# Patient Record
Sex: Female | Born: 2000 | Race: White | Hispanic: No | Marital: Single | Smoking: Never smoker
Health system: Southern US, Community
[De-identification: ages and names within clinical notes are randomized; demographics above are authoritative.]

## PROBLEM LIST (undated history)

## (undated) DIAGNOSIS — J45909 Unspecified asthma, uncomplicated: Secondary | ICD-10-CM

## (undated) HISTORY — DX: Unspecified asthma, uncomplicated: J45.909

## (undated) HISTORY — PX: ADENOIDECTOMY: SUR15

---
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
## 2019-03-11 ENCOUNTER — Ambulatory Visit: Payer: BLUE CROSS/BLUE SHIELD | Admitting: Osteopathic Medicine

## 2019-03-11 ENCOUNTER — Ambulatory Visit (INDEPENDENT_AMBULATORY_CARE_PROVIDER_SITE_OTHER): Payer: BLUE CROSS/BLUE SHIELD | Admitting: Osteopathic Medicine

## 2019-03-11 ENCOUNTER — Encounter: Payer: Self-pay | Admitting: Osteopathic Medicine

## 2019-03-11 VITALS — BP 99/68 | HR 67 | Temp 99.0°F | Wt 138.3 lb

## 2019-03-11 DIAGNOSIS — B354 Tinea corporis: Secondary | ICD-10-CM

## 2019-03-11 DIAGNOSIS — D229 Melanocytic nevi, unspecified: Secondary | ICD-10-CM | POA: Diagnosis not present

## 2019-03-11 MED ORDER — TERBINAFINE HCL 1 % EX CREA: TOPICAL_CREAM | CUTANEOUS | 1 refills | Status: DC

## 2019-03-11 NOTE — Progress Notes (Signed)
HPI: Bailey Pratt is a 18 y.o. female who  has a past medical history of Asthma.  she presents to [facility] today, 03/11/19,  for chief complaint of:  Rash   Rash . Context: reports has had this before  . Location: chest, back, creases of legs  . Quality: round scattered splotches . Duration: on and off for year+ . Timing: better when she's tan, worse w/ heat, gets a little itchy in the heat    Would also like a few moles looked at on abdomen and on right side of the head.        At today's visit 03/11/19 ... PMH, PSH, FH reviewed and updated as needed.  Current medication list and allergy/intolerance hx reviewed and updated as needed. (See remainder of HPI, ROS, Phys Exam below)   No results found.  No results found for this or any previous visit (from the past 72 hour(s)).        ASSESSMENT/PLAN: The primary encounter diagnosis was Tinea corporis. A diagnosis of Benign nevus of skin was also pertinent to this visit.   Requests topical treatment rather than oral   Consider oral if no improvement   Benign nevus on abdomen and on scalp, offered cryotherapy of scalp lesion but patient declined   No orders of the defined types were placed in this encounter.    Meds ordered this encounter  Medications  . terbinafine (LAMISIL) 1 % cream    Sig: Apply to affected area BID until rash gone, then apply 2 more days.    Dispense:  42 g    Refill:  1    There are no Patient Instructions on file for this visit.    Follow-up plan: Return if symptoms worsen or fail to improve.                                                 ################################################# ################################################# ################################################# #################################################    Current Meds  Medication Sig  . albuterol  (PROVENTIL HFA;VENTOLIN HFA) 108 (90 Base) MCG/ACT inhaler Inhale into the lungs.  . fexofenadine (ALLEGRA) 180 MG tablet Take by mouth.  . fluticasone (FLONASE) 50 MCG/ACT nasal spray Place into the nose.  . hydrOXYzine (ATARAX/VISTARIL) 10 MG tablet   . loratadine (CLARITIN) 10 MG tablet Take 10 mg by mouth.  . montelukast (SINGULAIR) 10 MG tablet Take 10 mg by mouth.  . MULTIPLE VITAMINS-MINERALS PO Take by mouth.    Allergies  Allergen Reactions  . Gluten Meal Other (See Comments)       Review of Systems:  Constitutional: No recent illness, no fever/chills  Cardiac: No  chest pain, No  pressure, No palpitations  Musculoskeletal: No new myalgia/arthralgia  Skin: +Rash  Neurologic: No  weakness, No  Dizziness   Exam:  BP 99/68 (BP Location: Left Arm, Patient Position: Sitting, Cuff Size: Normal)   Pulse 67   Temp 99 F (37.2 C) (Oral)   Wt 138 lb 4.8 oz (62.7 kg)   BMI 21.66 kg/m   Constitutional: VS see above. General Appearance: alert, well-developed, well-nourished, NAD  Eyes: Normal lids and conjunctive, non-icteric sclera  Neck: No masses, trachea midline.   Respiratory: Normal respiratory effort.   Musculoskeletal: Gait normal. Symmetric and independent movement of all extremities  Neurological: Normal balance/coordination. No tremor.  Skin: warm, dry,  intact. No rash apparent now but t has photos of chest and back w/ scattered hypopigmented patches c/w tinea. Small symmetric brown nevus on abdomen w/o discoloration and w/ clear distinct borders. Warty flesh colored growth on R scalp appears c/w cyst or HPV viral wart pt declines cryo   Psychiatric: Normal judgment/insight. Normal mood and affect. Oriented x3.       Visit summary with medication list and pertinent instructions was printed for patient to review, patient was advised to alert [place] if any updates are needed. All questions at time of visit were answered - patient instructed to contact office  with any additional concerns. ER/RTC precautions were reviewed with the patient and understanding verbalized.    Please note: voice recognition software was used to produce this document, and typos may escape review. Please contact Dr. Sheppard Coil for any needed clarifications.    Follow up plan: Return if symptoms worsen or fail to improve.

## 2019-04-11 ENCOUNTER — Encounter: Payer: Self-pay | Admitting: Osteopathic Medicine

## 2019-07-02 ENCOUNTER — Encounter: Payer: Self-pay | Admitting: Osteopathic Medicine

## 2019-07-02 NOTE — Telephone Encounter (Signed)
Acute tomorrow is ok w/ caveat (as always) that if worse or other concerns, go to ER if needed in the meantime!

## 2019-07-02 NOTE — Telephone Encounter (Signed)
Pt scheduled for eval tomorrow. Advised to visit ED tonight if symptoms return/worsen.

## 2019-07-03 ENCOUNTER — Ambulatory Visit (INDEPENDENT_AMBULATORY_CARE_PROVIDER_SITE_OTHER): Payer: BC Managed Care – PPO | Admitting: Osteopathic Medicine

## 2019-07-03 ENCOUNTER — Encounter: Payer: Self-pay | Admitting: Osteopathic Medicine

## 2019-07-03 ENCOUNTER — Other Ambulatory Visit: Payer: Self-pay

## 2019-07-03 VITALS — BP 115/75 | HR 86 | Temp 98.3°F | Wt 139.1 lb

## 2019-07-03 DIAGNOSIS — Z3202 Encounter for pregnancy test, result negative: Secondary | ICD-10-CM | POA: Diagnosis not present

## 2019-07-03 DIAGNOSIS — Z8619 Personal history of other infectious and parasitic diseases: Secondary | ICD-10-CM | POA: Diagnosis not present

## 2019-07-03 DIAGNOSIS — N939 Abnormal uterine and vaginal bleeding, unspecified: Secondary | ICD-10-CM | POA: Diagnosis not present

## 2019-07-03 DIAGNOSIS — B354 Tinea corporis: Secondary | ICD-10-CM

## 2019-07-03 DIAGNOSIS — K900 Celiac disease: Secondary | ICD-10-CM | POA: Diagnosis not present

## 2019-07-03 DIAGNOSIS — R42 Dizziness and giddiness: Secondary | ICD-10-CM

## 2019-07-03 LAB — POCT URINE PREGNANCY: Preg Test, Ur: NEGATIVE

## 2019-07-03 MED ORDER — TERBINAFINE HCL 1 % EX CREA: TOPICAL_CREAM | CUTANEOUS | 1 refills | Status: DC

## 2019-07-03 NOTE — Patient Instructions (Signed)
Normal neurologic exam Will get labs - I suspect anemia!  Will call you once I have results Ask OBGYN re: bleeding If nothing on labs, may consider specialist consultation   Call if worse/change, go to ER if needed especially if severe symptoms or loss of consciousness!

## 2019-07-03 NOTE — Progress Notes (Signed)
HPI: Bailey Pratt is a 18 y.o. female who  has a past medical history of Asthma.  she presents to [facility] today, 07/03/19,  for chief complaint of:  Dizziness, just feeling "off" . Context: concerned given her history of celiac (see MyChart message below)  . Quality: feels unbalanced, describes as "like walking on or thorugh foam"  . Duration: few days  . Timing: intermittent, random  . Assoc signs/symptoms: +fatigue, no diarrhea, no fever, no pain, no headache, no vision changes   Vaginal bleeding for about a month, also had some cramping. Switched to Depo shot earlier this year and has been amenorrheic for some time   Message sent yesterday: "Hello, the past couple days I have felt off. Last night I was walking and felt like I stepped down and my foot just kept going. I lost my vision for a split second when that happened and then felt weird the rest of the night. Kind of uneasy and off balance. This morning the same thing happened. I have also felt pretty weak. I do have an autoimmune disease so I am not sure if some of my vitamins are low and it's throwing me off or what it could be. Should I schedule an appointment or do you have any other recommendations?"  Skin: Also few spots on arms concerning for ringworm, thinks migt have picked up from recently adopted cat       At today's visit 07/03/19 ... PMH, PSH, FH reviewed and updated as needed.  Current medication list and allergy/intolerance hx reviewed and updated as needed. (See remainder of HPI, ROS, Phys Exam below)   No results found.  Results for orders placed or performed in visit on 07/03/19 (from the past 72 hour(s))  POCT urine pregnancy     Status: None   Collection Time: 07/03/19  1:27 PM  Result Value Ref Range   Preg Test, Ur Negative Negative          ASSESSMENT/PLAN: The primary encounter diagnosis was Vaginal bleeding. Diagnoses of Tinea corporis, Celiac  disease, Hx of Lyme disease, and Dizziness were also pertinent to this visit.   Orders Placed This Encounter  Procedures  . CBC with Differential/Platelet  . COMPLETE METABOLIC PANEL WITH GFR  . TSH  . Magnesium  . Vitamin B12  . Urinalysis, Routine w reflex microscopic  . B. burgdorfi antibodies  . Fe+TIBC+Fer  . POCT urine pregnancy     Meds ordered this encounter  Medications  . terbinafine (LAMISIL) 1 % cream    Sig: Apply to affected area BID until rash gone, then apply 2 more days.    Dispense:  42 g    Refill:  1    Patient Instructions  Normal neurologic exam Will get labs - I suspect anemia!  Will call you once I have results Ask OBGYN re: bleeding If nothing on labs, may consider specialist consultation   Call if worse/change, go to ER if needed especially if severe symptoms or loss of consciousness!      Follow-up plan: Return if symptoms worsen or fail to improve.                                                 ################################################# ################################################# ################################################# #################################################    No outpatient medications have been marked as taking  for the 07/03/19 encounter (Appointment) with Emeterio Reeve, DO.    Allergies  Allergen Reactions  . Gluten Meal Other (See Comments)       Review of Systems:  Constitutional: No recent illness  HEENT: No  headache, no vision change  Cardiac: No  chest pain, No  pressure, No palpitations  Respiratory:  No  shortness of breath. No  Cough  Gastrointestinal: No  abdominal pain, no change on bowel habits  Musculoskeletal: No new myalgia/arthralgia  Skin: No  Rash  Hem/Onc: No  easy bruising/bleeding, No  abnormal lumps/bumps  Neurologic: No  weakness, +Dizziness  Psychiatric: No  concerns with depression, No  concerns with  anxiety  Exam:  BP 115/75 (BP Location: Left Arm, Patient Position: Sitting, Cuff Size: Normal)   Pulse 86   Temp 98.3 F (36.8 C) (Oral)   Wt 139 lb 1.9 oz (63.1 kg)   BMI 21.79 kg/m   Constitutional: VS see above. General Appearance: alert, well-developed, well-nourished, NAD  Eyes: Normal lids and conjunctive, non-icteric sclera  Ears, Nose, Mouth, Throat: MMM, Normal external inspection ears/nares/mouth/lips/gums.  Neck: No masses, trachea midline.   Respiratory: Normal respiratory effort. no wheeze, no rhonchi, no rales  Cardiovascular: S1/S2 normal, no murmur, no rub/gallop auscultated. RRR.   Musculoskeletal: Gait normal. Symmetric and independent movement of all extremities  Abdominal: non-tender, non-distended, no appreciable organomegaly, neg Murphy's, BS [id number]  Neurological: Normal balance/coordination. No tremor. EOMI, PERRL, strength 5/5 and symmetric all extremities, normal balance,   Skin: warm, dry, intact.   Psychiatric: Normal judgment/insight. Normal mood and affect. Oriented x3.       Visit summary with medication list and pertinent instructions was printed for patient to review, patient was advised to alert [place] if any updates are needed. All questions at time of visit were answered - patient instructed to contact office with any additional concerns. ER/RTC precautions were reviewed with the patient and understanding verbalized.      Please note: voice recognition software was used to produce this document, and typos may escape review. Please contact Dr. Sheppard Coil for any needed clarifications.    Follow up plan: Return if symptoms worsen or fail to improve.

## 2019-07-04 ENCOUNTER — Other Ambulatory Visit: Payer: Self-pay | Admitting: Osteopathic Medicine

## 2019-07-04 LAB — CBC WITH DIFFERENTIAL/PLATELET
Absolute Monocytes: 455 cells/uL (ref 200–950)
Basophils Absolute: 28 cells/uL (ref 0–200)
Basophils Relative: 0.4 %
Eosinophils Absolute: 186 cells/uL (ref 15–500)
Eosinophils Relative: 2.7 %
HCT: 41.7 % (ref 35.0–45.0)
Hemoglobin: 14.7 g/dL (ref 11.7–15.5)
Lymphs Abs: 2512 cells/uL (ref 850–3900)
MCH: 32.5 pg (ref 27.0–33.0)
MCHC: 35.3 g/dL (ref 32.0–36.0)
MCV: 92.3 fL (ref 80.0–100.0)
MPV: 12.1 fL (ref 7.5–12.5)
Monocytes Relative: 6.6 %
Neutro Abs: 3719 cells/uL (ref 1500–7800)
Neutrophils Relative %: 53.9 %
Platelets: 249 10*3/uL (ref 140–400)
RBC: 4.52 10*6/uL (ref 3.80–5.10)
RDW: 11.2 % (ref 11.0–15.0)
Total Lymphocyte: 36.4 %
WBC: 6.9 10*3/uL (ref 3.8–10.8)

## 2019-07-04 LAB — COMPLETE METABOLIC PANEL WITH GFR
AG Ratio: 1.7 (calc) (ref 1.0–2.5)
ALT: 13 U/L (ref 5–32)
AST: 15 U/L (ref 12–32)
Albumin: 4.5 g/dL (ref 3.6–5.1)
Alkaline phosphatase (APISO): 58 U/L (ref 36–128)
BUN: 9 mg/dL (ref 7–20)
CO2: 25 mmol/L (ref 20–32)
Calcium: 9.9 mg/dL (ref 8.9–10.4)
Chloride: 105 mmol/L (ref 98–110)
Creat: 0.76 mg/dL (ref 0.50–1.00)
GFR, Est African American: 132 mL/min/{1.73_m2} (ref >=60)
GFR, Est Non African American: 114 mL/min/{1.73_m2} (ref >=60)
Globulin: 2.6 g/dL (calc) (ref 2.0–3.8)
Glucose, Bld: 113 mg/dL — ABNORMAL HIGH (ref 65–99)
Potassium: 3.7 mmol/L — ABNORMAL LOW (ref 3.8–5.1)
Sodium: 140 mmol/L (ref 135–146)
Total Bilirubin: 0.4 mg/dL (ref 0.2–1.1)
Total Protein: 7.1 g/dL (ref 6.3–8.2)

## 2019-07-04 LAB — VITAMIN B12: Vitamin B-12: 469 pg/mL (ref 200–1100)

## 2019-07-04 LAB — MAGNESIUM: Magnesium: 2 mg/dL (ref 1.5–2.5)

## 2019-07-04 LAB — IRON,TIBC AND FERRITIN PANEL
%SAT: 41 % (calc) (ref 15–45)
Ferritin: 34 ng/mL (ref 16–154)
Iron: 120 ug/dL (ref 27–164)
TIBC: 295 mcg/dL (calc) (ref 271–448)

## 2019-07-04 LAB — TSH: TSH: 1.24 mIU/L

## 2019-07-04 LAB — B. BURGDORFI ANTIBODIES: B burgdorferi Ab IgG+IgM: <0.9 index

## 2019-07-07 ENCOUNTER — Encounter: Payer: Self-pay | Admitting: Osteopathic Medicine

## 2019-07-08 MED ORDER — TERBINAFINE HCL 250 MG PO TABS: 250.0000 mg | ORAL_TABLET | Freq: Every day | ORAL | 0 refills | Status: DC

## 2019-10-18 ENCOUNTER — Encounter: Payer: Self-pay | Admitting: Osteopathic Medicine

## 2019-11-06 ENCOUNTER — Encounter: Payer: Self-pay | Admitting: Osteopathic Medicine

## 2020-05-06 ENCOUNTER — Other Ambulatory Visit: Payer: Self-pay

## 2020-05-06 ENCOUNTER — Emergency Department (INDEPENDENT_AMBULATORY_CARE_PROVIDER_SITE_OTHER): Payer: BC Managed Care – PPO

## 2020-05-06 ENCOUNTER — Emergency Department (INDEPENDENT_AMBULATORY_CARE_PROVIDER_SITE_OTHER)
Admission: RE | Admit: 2020-05-06 | Discharge: 2020-05-06 | Disposition: A | Discharge status: Home / Self Care | Payer: BC Managed Care – PPO | Source: Ambulatory Visit | Attending: Internal Medicine | Admitting: Internal Medicine

## 2020-05-06 VITALS — BP 120/82 | HR 96 | Temp 99.2°F | Resp 18

## 2020-05-06 DIAGNOSIS — R05 Cough: Secondary | ICD-10-CM | POA: Diagnosis not present

## 2020-05-06 DIAGNOSIS — R0602 Shortness of breath: Secondary | ICD-10-CM | POA: Diagnosis not present

## 2020-05-06 DIAGNOSIS — U071 COVID-19: Secondary | ICD-10-CM

## 2020-05-06 DIAGNOSIS — R058 Other specified cough: Secondary | ICD-10-CM

## 2020-05-06 MED ORDER — BENZONATATE 100 MG PO CAPS: 100.0000 mg | ORAL_CAPSULE | Freq: Three times a day (TID) | ORAL | 0 refills | Status: AC

## 2020-05-06 NOTE — ED Triage Notes (Signed)
Patient presents to Urgent Care with complaints of continued shortness of breath since she was diagnosed w/ covid 2 weeks ago. Patient reports she has asthma and celiac and is worried they are prolonging her symptoms. Pt has been using her inhaler twice per day.

## 2020-05-06 NOTE — ED Provider Notes (Signed)
[facility]    CSN: [id number] Arrival date & time: 05/06/20  1442      History   Chief Complaint Chief Complaint  Patient presents with  . Appointment  . Shortness of Breath    COVID Positive    HPI Bailey Pratt is a 19 y.o. female who was diagnosed with COVID-19 about a couple weeks ago comes to the urgent care with persistent shortness of breath, wheezing and a nonproductive cough.  Patient says the symptoms have not significantly improved.  She denies any sputum production.  No fever or chills.  No body aches.  Shortness of breath is aggravated by movement and physical activity.   HPI  Past Medical History:  Diagnosis Date  . Asthma     Patient Active Problem List   Diagnosis Date Noted  . 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 Generalized anxiety disorder 11/29/2016  . 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 Abdominal pain, chronic, generalized 10/31/2016  . Celiac disease 05/24/2016  . 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 Weight loss 11/17/2015    Past Surgical History:  Procedure Laterality Date  . ADENOIDECTOMY      OB History   No obstetric history on file.      Home Medications    Prior to Admission medications   Medication Sig Start Date End Date Taking? Authorizing Provider  albuterol (PROVENTIL HFA;VENTOLIN HFA) 108 (90 Base) MCG/ACT inhaler Inhale into the lungs.    [provider]  budesonide-formoterol (SYMBICORT) 80-4.5 MCG/ACT inhaler Inhale into the lungs.    [provider]  cyproheptadine (PERIACTIN) 4 MG tablet  12/13/16   [provider]  fexofenadine (ALLEGRA) 180 MG tablet Take by mouth.    [provider]  fluticasone (FLONASE) 50 MCG/ACT nasal spray Place into the nose.    [provider]  hydrOXYzine (ATARAX/VISTARIL) 10 MG tablet  10/31/16   [provider]  hyoscyamine (LEVSIN, ANASPAZ) 0.125 MG tablet  10/31/16   [provider]  loratadine (CLARITIN) 10 MG tablet Take 10 mg by mouth.    [provider]  montelukast (SINGULAIR) 10 MG tablet Take 10 mg by mouth.    [provider]  MULTIPLE VITAMINS-MINERALS PO Take by mouth.    [provider]  terbinafine (LAMISIL) 1 % cream Apply to affected area BID until rash gone, then apply 2 more days. 07/03/19   Emeterio Reeve, DO  terbinafine (LAMISIL) 250 MG tablet Take 1 tablet (250 mg total) by mouth daily. 07/08/19   Emeterio Reeve, DO    Family History Family History  Problem Relation Age of Onset  . Thyroid disease Father   . Stroke Maternal Grandmother   . Lymphoma Paternal Grandmother   . Diabetes Maternal Aunt   . Hashimoto's thyroiditis Paternal Aunt   . Healthy Mother     Social History Social History   Tobacco Use  . Smoking status: Never Smoker  . Smokeless tobacco: Never Used  Vaping Use  . Vaping Use: Never used  Substance Use Topics  . Alcohol use: Never  . Drug use: Never     Allergies   Gluten meal   Review of Systems Review of Systems  Respiratory: Positive for cough, chest tightness, shortness of breath and wheezing.   Cardiovascular: Negative for chest pain and palpitations.  Gastrointestinal: Negative.   Psychiatric/Behavioral: Negative for confusion and decreased concentration.     Physical Exam Triage Vital Signs ED Triage Vitals  Enc Vitals  Group     BP 05/06/20 1530 120/82     Pulse Rate 05/06/20 1530 96     Resp 05/06/20 1530 18     Temp 05/06/20 1530 99.2 F (37.3 C)     Temp Source 05/06/20 1530 Oral     SpO2 05/06/20 1530 97 %     Weight --      Height --      Head Circumference --      Peak Flow --      Pain Score 05/06/20 1528 0     Pain Loc --      Pain Edu? --      Excl. in [place]? --    No data found.  Updated Vital Signs BP 120/82 (BP Location: Right Arm)   Pulse 96   Temp 99.2 F (37.3 C) (Oral)   Resp 18   SpO2 97%   Visual Acuity Right Eye  Distance:   Left Eye Distance:   Bilateral Distance:    Right Eye Near:   Left Eye Near:    Bilateral Near:     Physical Exam Constitutional:      General: She is not in acute distress.    Appearance: She is well-developed. She is not ill-appearing.  Cardiovascular:     Rate and Rhythm: Normal rate and regular rhythm.  Pulmonary:     Effort: Pulmonary effort is normal.     Breath sounds: Normal breath sounds. No wheezing, rhonchi or rales.  Neurological:     Mental Status: She is alert.      UC Treatments / Results  Labs (all labs ordered are listed, but only abnormal results are displayed) Labs Reviewed - No data to display  EKG   Radiology No results found.  Procedures Procedures (including critical care time)  Medications Ordered in UC Medications - No data to display  Initial Impression / Assessment and Plan / UC Course  I have reviewed the triage vital signs and the nursing notes.  Pertinent labs & imaging results that were available during my care of the patient were reviewed by me and considered in my medical decision making (see chart for details).     1.  COVID-19 infection with persistent shortness of breath: Chest x-ray is negative for acute lung infiltrate Patient is encouraged to use her rescue inhaler and nebulizer treatments more frequently Her symptoms may persist for a while Tessalon Perles as needed for cough Return precautions given. Final Clinical Impressions(s) / UC Diagnoses   Final diagnoses:  None   Discharge Instructions   None    ED Prescriptions    None     PDMP not reviewed this encounter.   Chase Picket, MD 05/06/20 [phone]

## 2020-05-06 NOTE — Discharge Instructions (Signed)
Continue rescue inhaler use.  You can use up to every 4-6 hours as needed Increase physical activity as tolerated Should your symptoms worsen-please return to urgent care to be reevaluated.

## 2020-05-07 ENCOUNTER — Telehealth: Payer: Self-pay

## 2020-05-07 NOTE — Telephone Encounter (Signed)
Patient called stating she is about 2 weeks post COVID diagnosis and is still feeling some shortness of breath, cough, and "sensation of something being stuck too far up my right nostril".   Patient was evaluated in urgent care yesterday and had CXR done, result was normal. Patient confirms she is taking her coughing pearls and using her inhalers (albuterol and Symbicort). She states at home her oxygen goes between 90 and 96, never steady on any number and always fluctuating. Patient advised to continue with current meds. If oxygent stays in lower 90s or drops below 90 and/or if she develops labored breathing with her shortness of breath to report to ER. Patient agreeable. Advised her she can always schedule virtual follow up with [place] also if wants to speak with a provider. Patient grateful, will continue to self-monitor at home.

## 2020-05-12 ENCOUNTER — Ambulatory Visit: Payer: Self-pay

## 2021-01-26 IMAGING — DX DG CHEST 2V
2 series · 2 of 2 positions shown · non-contrast
Comparison: None.

CLINICAL DATA: Shortness of breath.

EXAM:
CHEST - 2 VIEW

[chest pa]
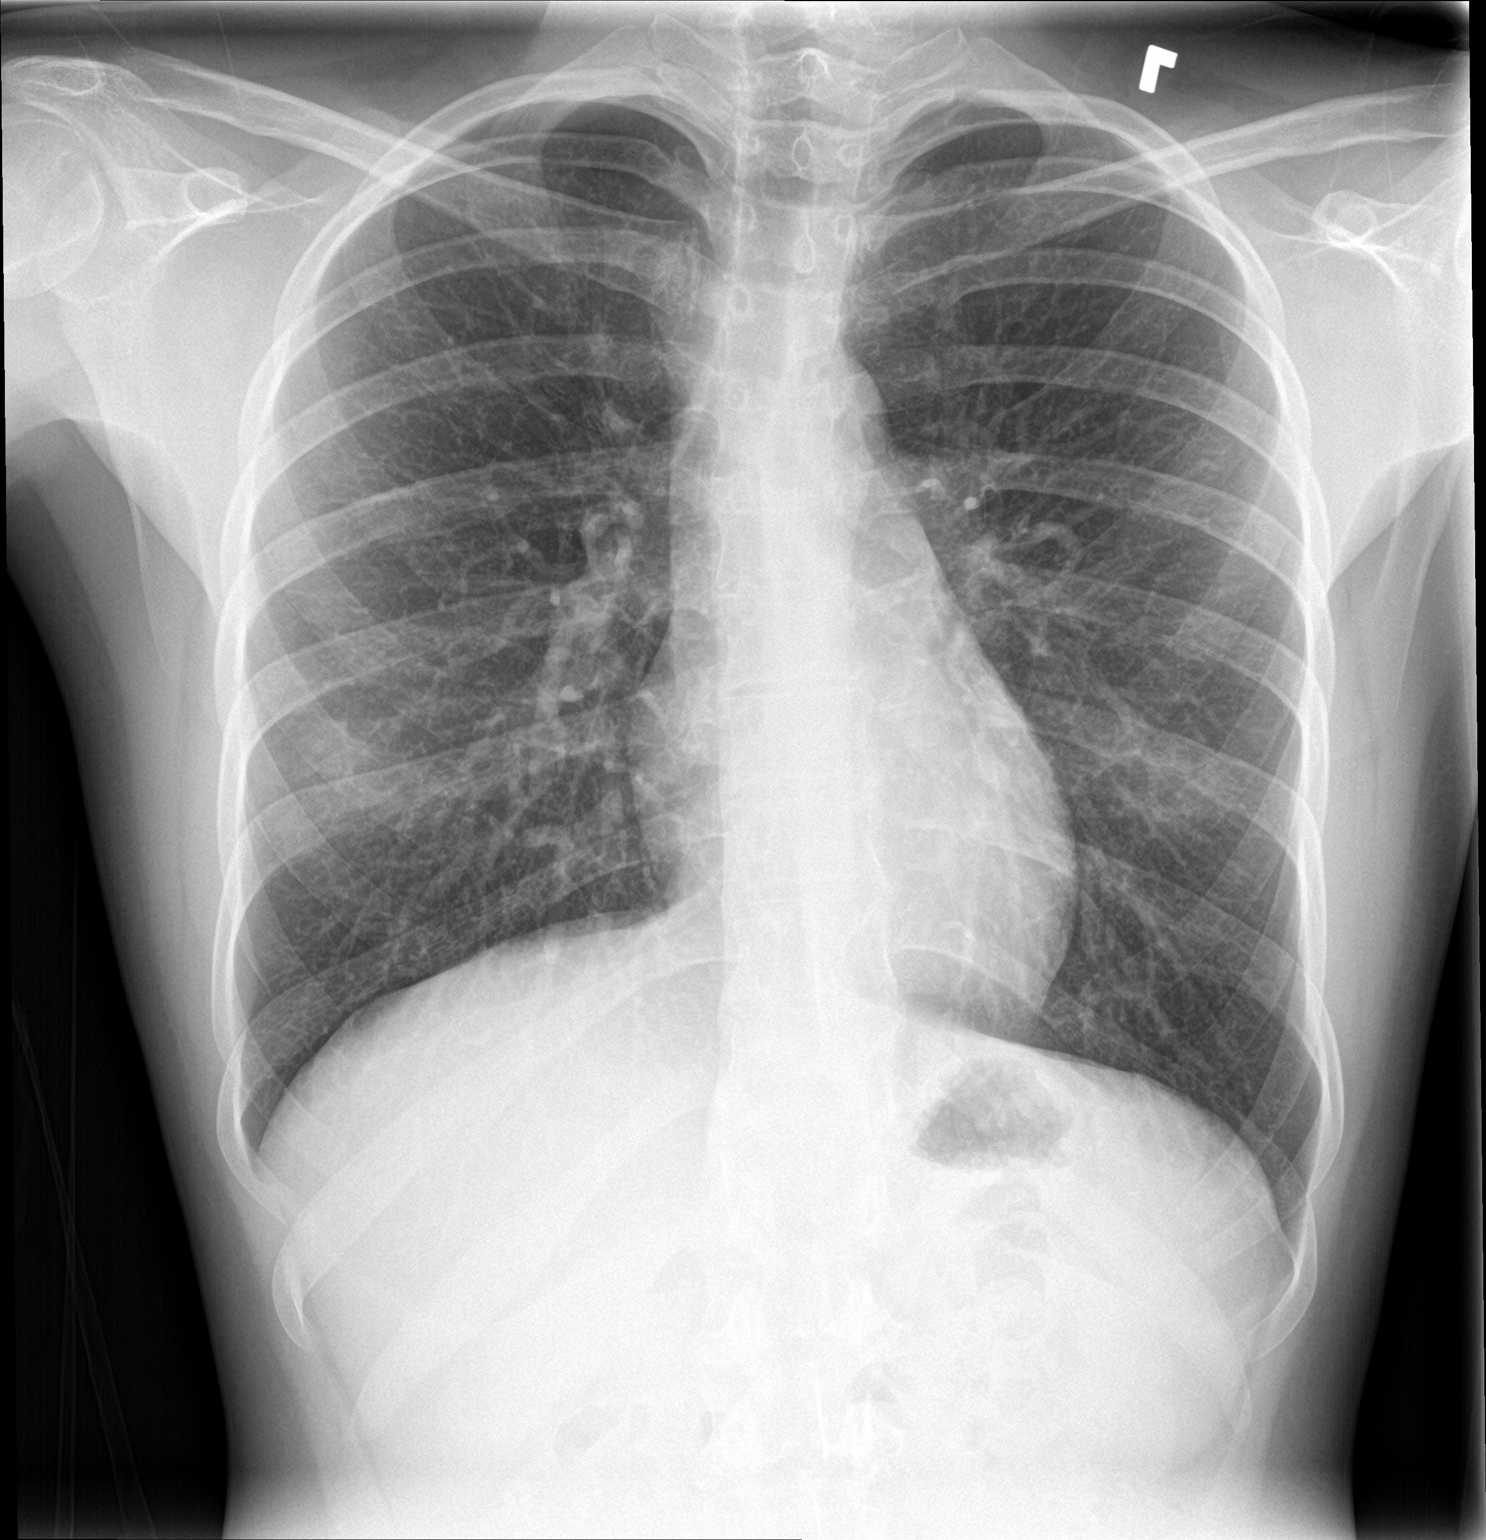

[chest lat]
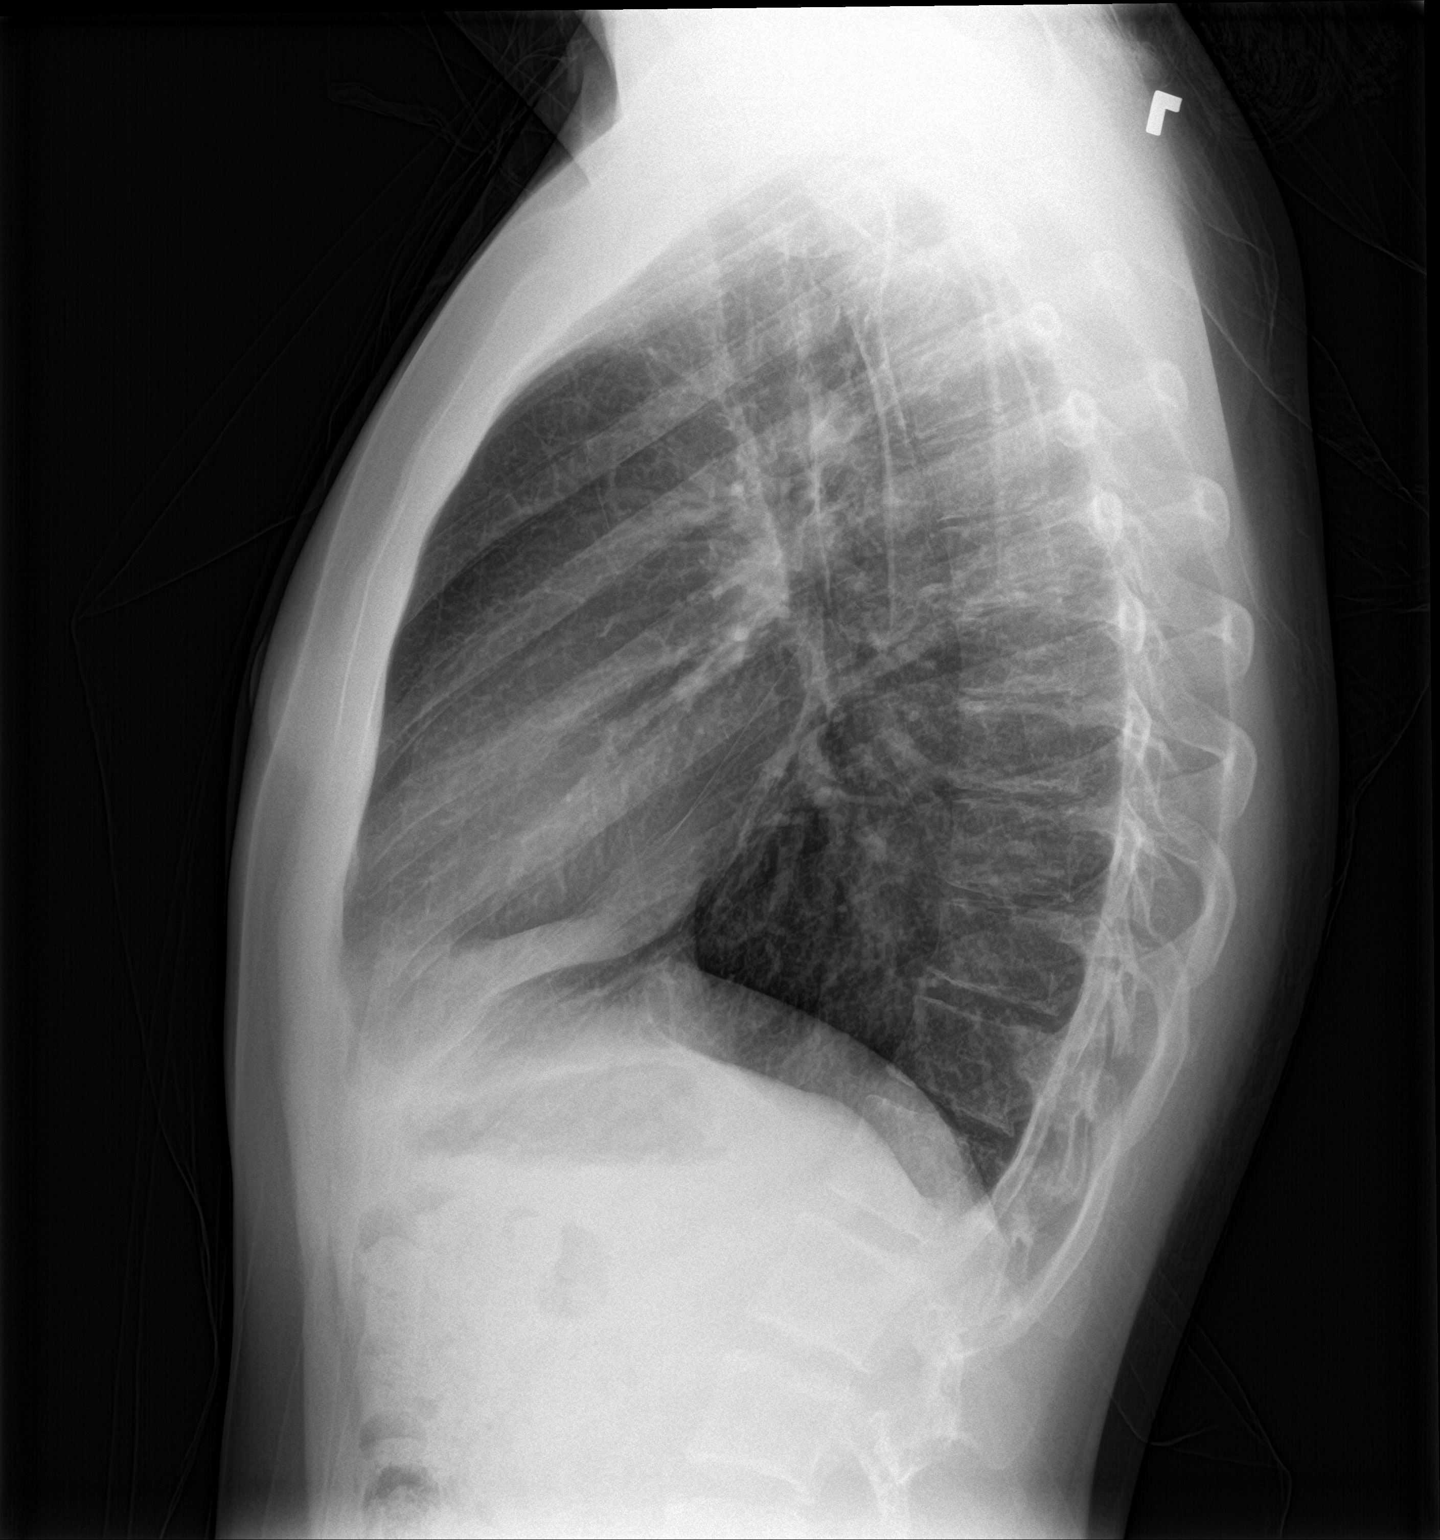

[2 of 2 positions shown; findings below may reference images not displayed]

FINDINGS: The heart size and mediastinal contours are within normal limits.
Both lungs are clear. No pneumothorax or pleural effusion is noted.
The visualized skeletal structures are unremarkable.
IMPRESSION: No active cardiopulmonary disease.

## 2022-01-10 ENCOUNTER — Ambulatory Visit: Payer: BC Managed Care – PPO | Admitting: Family Medicine
